# Patient Record
Sex: Male | Born: 1970 | Race: White | Hispanic: No | Marital: Married | Smoking: Current every day smoker
Health system: Southern US, Community
[De-identification: ages and names within clinical notes are randomized; demographics above are authoritative.]

## PROBLEM LIST (undated history)

## (undated) HISTORY — PX: INGUINAL HERNIA REPAIR: SUR1180

---

## 2003-04-03 ENCOUNTER — Emergency Department (HOSPITAL_COMMUNITY): Admission: EM | Admit: 2003-04-03 | Discharge: 2003-04-03 | Payer: Self-pay | Admitting: Emergency Medicine

## 2004-08-14 HISTORY — PX: VASECTOMY: SHX75

## 2015-04-29 ENCOUNTER — Emergency Department (HOSPITAL_COMMUNITY)
Admission: EM | Admit: 2015-04-29 | Discharge: 2015-04-29 | Disposition: A | Discharge status: Home / Self Care | Payer: Self-pay | Attending: Emergency Medicine | Admitting: Emergency Medicine

## 2015-04-29 ENCOUNTER — Emergency Department (HOSPITAL_COMMUNITY): Payer: Self-pay

## 2015-04-29 ENCOUNTER — Encounter (HOSPITAL_COMMUNITY): Payer: Self-pay | Admitting: *Deleted

## 2015-04-29 DIAGNOSIS — Z8719 Personal history of other diseases of the digestive system: Secondary | ICD-10-CM | POA: Insufficient documentation

## 2015-04-29 DIAGNOSIS — Z720 Tobacco use: Secondary | ICD-10-CM | POA: Insufficient documentation

## 2015-04-29 DIAGNOSIS — R079 Chest pain, unspecified: Secondary | ICD-10-CM | POA: Insufficient documentation

## 2015-04-29 DIAGNOSIS — Z79899 Other long term (current) drug therapy: Secondary | ICD-10-CM | POA: Insufficient documentation

## 2015-04-29 LAB — CBC WITH DIFFERENTIAL/PLATELET
Basophils Absolute: 0 10*3/uL (ref 0.0–0.1)
Basophils Relative: 0 %
EOS ABS: 0.2 10*3/uL (ref 0.0–0.7)
EOS PCT: 3 %
HCT: 42.4 % (ref 39.0–52.0)
Hemoglobin: 14.8 g/dL (ref 13.0–17.0)
LYMPHS ABS: 1.5 10*3/uL (ref 0.7–4.0)
LYMPHS PCT: 21 %
MCH: 33.3 pg (ref 26.0–34.0)
MCHC: 34.9 g/dL (ref 30.0–36.0)
MCV: 95.3 fL (ref 78.0–100.0)
MONO ABS: 0.5 10*3/uL (ref 0.1–1.0)
MONOS PCT: 7 %
Neutro Abs: 5 10*3/uL (ref 1.7–7.7)
Neutrophils Relative %: 69 %
PLATELETS: 233 10*3/uL (ref 150–400)
RBC: 4.45 MIL/uL (ref 4.22–5.81)
RDW: 12.3 % (ref 11.5–15.5)
WBC: 7.3 10*3/uL (ref 4.0–10.5)

## 2015-04-29 LAB — BASIC METABOLIC PANEL
Anion gap: 7 (ref 5–15)
BUN: 17 mg/dL (ref 6–20)
CHLORIDE: 102 mmol/L (ref 101–111)
CO2: 29 mmol/L (ref 22–32)
CREATININE: 0.97 mg/dL (ref 0.61–1.24)
Calcium: 8.8 mg/dL — ABNORMAL LOW (ref 8.9–10.3)
GFR calc Af Amer: >60 mL/min (ref >60)
GFR calc non Af Amer: >60 mL/min (ref >60)
GLUCOSE: 100 mg/dL — AB (ref 65–99)
POTASSIUM: 3.8 mmol/L (ref 3.5–5.1)
SODIUM: 138 mmol/L (ref 135–145)

## 2015-04-29 LAB — TROPONIN I

## 2015-04-29 NOTE — ED Provider Notes (Signed)
CSN: [id number]     Arrival date & time 04/29/15  1526 History   First MD Initiated Contact with Patient 04/29/15 1533     Chief Complaint  Patient presents with  . Chest Pain     (Consider location/radiation/quality/duration/timing/severity/associated sxs/prior Treatment) HPI  The patient is a 44 year old male, history of tobacco use, history of using supplements, has been using supplements for erectile dysfunction recently, one of these was "horny goat weed". He states that he has had chest pressure and heaviness over the last several days, seems to get worse on and off throughout the day, he does not exercise but is in good shape, never has any exertional symptoms, over the last several days he has noticed increased symptoms but not always with exertion, sometimes at rest, no associated shortness of breath, cough, vomiting, swelling of the legs, no risk factors for pulmonary embolism, takes no other medications. He stopped taking the supplements several days ago. There is no dysuria, diarrhea, rashes, headache, blurred vision or any other symptoms. There is no radiation of this discomfort to his arm shoulder or jaw.  History reviewed. No pertinent past medical history. Past Surgical History  Procedure Laterality Date  . Inguinal hernia repair Bilateral   . Vasectomy  2006   Family History  Problem Relation Age of Onset  . Heart attack Maternal Grandfather    Social History  Substance Use Topics  . Smoking status: Current Every Day Smoker -- 0.50 packs/day for 4 years    Types: Cigarettes  . Smokeless tobacco: None  . Alcohol Use: 1.8 oz/week    2 Shots of liquor, 1 Cans of beer per week    Review of Systems  All other systems reviewed and are negative.     Allergies  Other  Home Medications   Prior to Admission medications   Medication Sig Start Date End Date Taking? Authorizing Provider  Misc Natural Products (HORNY GOAT WEED) CAPS Take 1 capsule by mouth 2 (two)  times daily.   Yes Historical Provider, MD  Multiple Vitamin (MULTI VITAMIN DAILY PO) Take 1 tablet by mouth 2 (two) times daily.   Yes Historical Provider, MD  YOHIMBINE HCL PO Take 1 capsule by mouth daily.   Yes Historical Provider, MD   BP 109/81 mmHg  Pulse 69  Temp(Src) 98.3 F (36.8 C) (Oral)  Resp 12  Ht 6' (1.829 m)  Wt 175 lb (79.379 kg)  BMI 23.73 kg/m2  SpO2 100% Physical Exam  Constitutional: He appears well-developed and well-nourished. No distress.  HENT:  Head: Normocephalic and atraumatic.  Mouth/Throat: Oropharynx is clear and moist. No oropharyngeal exudate.  Eyes: Conjunctivae and EOM are normal. Pupils are equal, round, and reactive to light. Right eye exhibits no discharge. Left eye exhibits no discharge. No scleral icterus.  Neck: Normal range of motion. Neck supple. No JVD present. No thyromegaly present.  Cardiovascular: Normal rate, regular rhythm, normal heart sounds and intact distal pulses.  Exam reveals no gallop and no friction rub.   No murmur heard. Pulmonary/Chest: Effort normal and breath sounds normal. No respiratory distress. He has no wheezes. He has no rales.  Abdominal: Soft. Bowel sounds are normal. He exhibits no distension and no mass. There is no tenderness.  Musculoskeletal: Normal range of motion. He exhibits no edema or tenderness.  Lymphadenopathy:    He has no cervical adenopathy.  Neurological: He is alert. Coordination normal.  Skin: Skin is warm and dry. No rash noted. No erythema.  Psychiatric: He  has a normal mood and affect. His behavior is normal.  Nursing note and vitals reviewed.   ED Course  Procedures (including critical care time) Labs Review Labs Reviewed  BASIC METABOLIC PANEL - Abnormal; Notable for the following:    Glucose, Bld 100 (*)    Calcium 8.8 (*)    All other components within normal limits  CBC WITH DIFFERENTIAL/PLATELET  TROPONIN I    Imaging Review Dg Chest 2 View  04/29/2015   CLINICAL DATA:   Chest pain 3-4 days which and screws 1 laying down. Pain relief with 1 nitroglycerin. Nausea and vomiting 1 episode yesterday morning.  EXAM: CHEST  2 VIEW  COMPARISON:  None.  FINDINGS: Lungs are mildly hyperexpanded without consolidation or effusion. Cardiomediastinal silhouette is normal remaining bones and soft tissues are within normal.  IMPRESSION: No active cardiopulmonary disease.   Electronically Signed   By: Elberta Fortis M.D.   On: 04/29/2015 16:23   I have personally reviewed and evaluated these images and lab results as part of my medical decision-making.   EKG Interpretation   Date/Time:  Thursday April 29 2015 15:30:12 EDT Ventricular Rate:  77 PR Interval:  139 QRS Duration: 105 QT Interval:  381 QTC Calculation: 431 R Axis:   83 Text Interpretation:  Sinus rhythm Normal ECG No old tracing to compare  Confirmed by Deneshia Zucker  MD, Jhada Risk ([id number]) on 04/29/2015 3:34:35 PM      MDM   Final diagnoses:  Chest pain, unspecified chest pain type    The patient has low risk for coronary disease, EKG is normal, clinical exam is normal, check chest x-ray labs, symptoms have been ongoing for several days with a heaviness, no wheezing, no respiratory distress, may be related to supplements  Pt has normal VS - ECG and Trop are normal - with 4 days of ongoing sx - there is low risk of ACS - recommended outpt f/u.  Meds given in ED:  Medications - No data to display  New Prescriptions   No medications on file      Eber Hong, MD 04/29/15 1702

## 2015-04-29 NOTE — ED Notes (Signed)
MD at bedside. 

## 2015-04-29 NOTE — ED Notes (Addendum)
Pt c/o CP x 3-4 days. Pt called [facility] and they told pt to call EMS to come to ED. CP gets better upon laying down. EMS started 18g IV in lt a/c. 4 baby ASA and 1 Nitro given by EMS with pain complete relief. EKG showed NSR and Vitals WNL for EMS. Pt reports nausea with vomiting x 1 yesterday morning. Pt also reports weakness with CP.

## 2015-04-29 NOTE — Discharge Instructions (Signed)
Your testing has been normal - as you are having ongoing pain and the testing was normal - it is suggestive that your symptoms are not from your heart - please return immediately if they worsen.  Please obtain all of your results from medical records or have your doctors office obtain the results - share them with your doctor - you should be seen at your doctors office in the next 2 days. Call today to arrange your follow up. Take the medications as prescribed. Please review all of the medicines and only take them if you do not have an allergy to them. Please be aware that if you are taking birth control pills, taking other prescriptions, ESPECIALLY ANTIBIOTICS may make the birth control ineffective - if this is the case, either do not engage in sexual activity or use alternative methods of birth control such as condoms until you have finished the medicine and your family doctor says it is OK to restart them. If you are on a blood thinner such as COUMADIN, be aware that any other medicine that you take may cause the coumadin to either work too much, or not enough - you should have your coumadin level rechecked in next 7 days if this is the case.  ?  It is also a possibility that you have an allergic reaction to any of the medicines that you have been prescribed - Everybody reacts differently to medications and while MOST people have no trouble with most medicines, you may have a reaction such as nausea, vomiting, rash, swelling, shortness of breath. If this is the case, please stop taking the medicine immediately and contact your physician.  ?  You should return to the ER if you develop severe or worsening symptoms.

## 2017-03-27 IMAGING — DX DG CHEST 2V
2 series · 2 of 2 positions shown · non-contrast
Comparison: None.

CLINICAL DATA: Chest pain 3-4 days which and screws 1 laying down.
Pain relief with 1 nitroglycerin. Nausea and vomiting 1 episode
yesterday morning.

EXAM:
CHEST  2 VIEW

[chest pa]
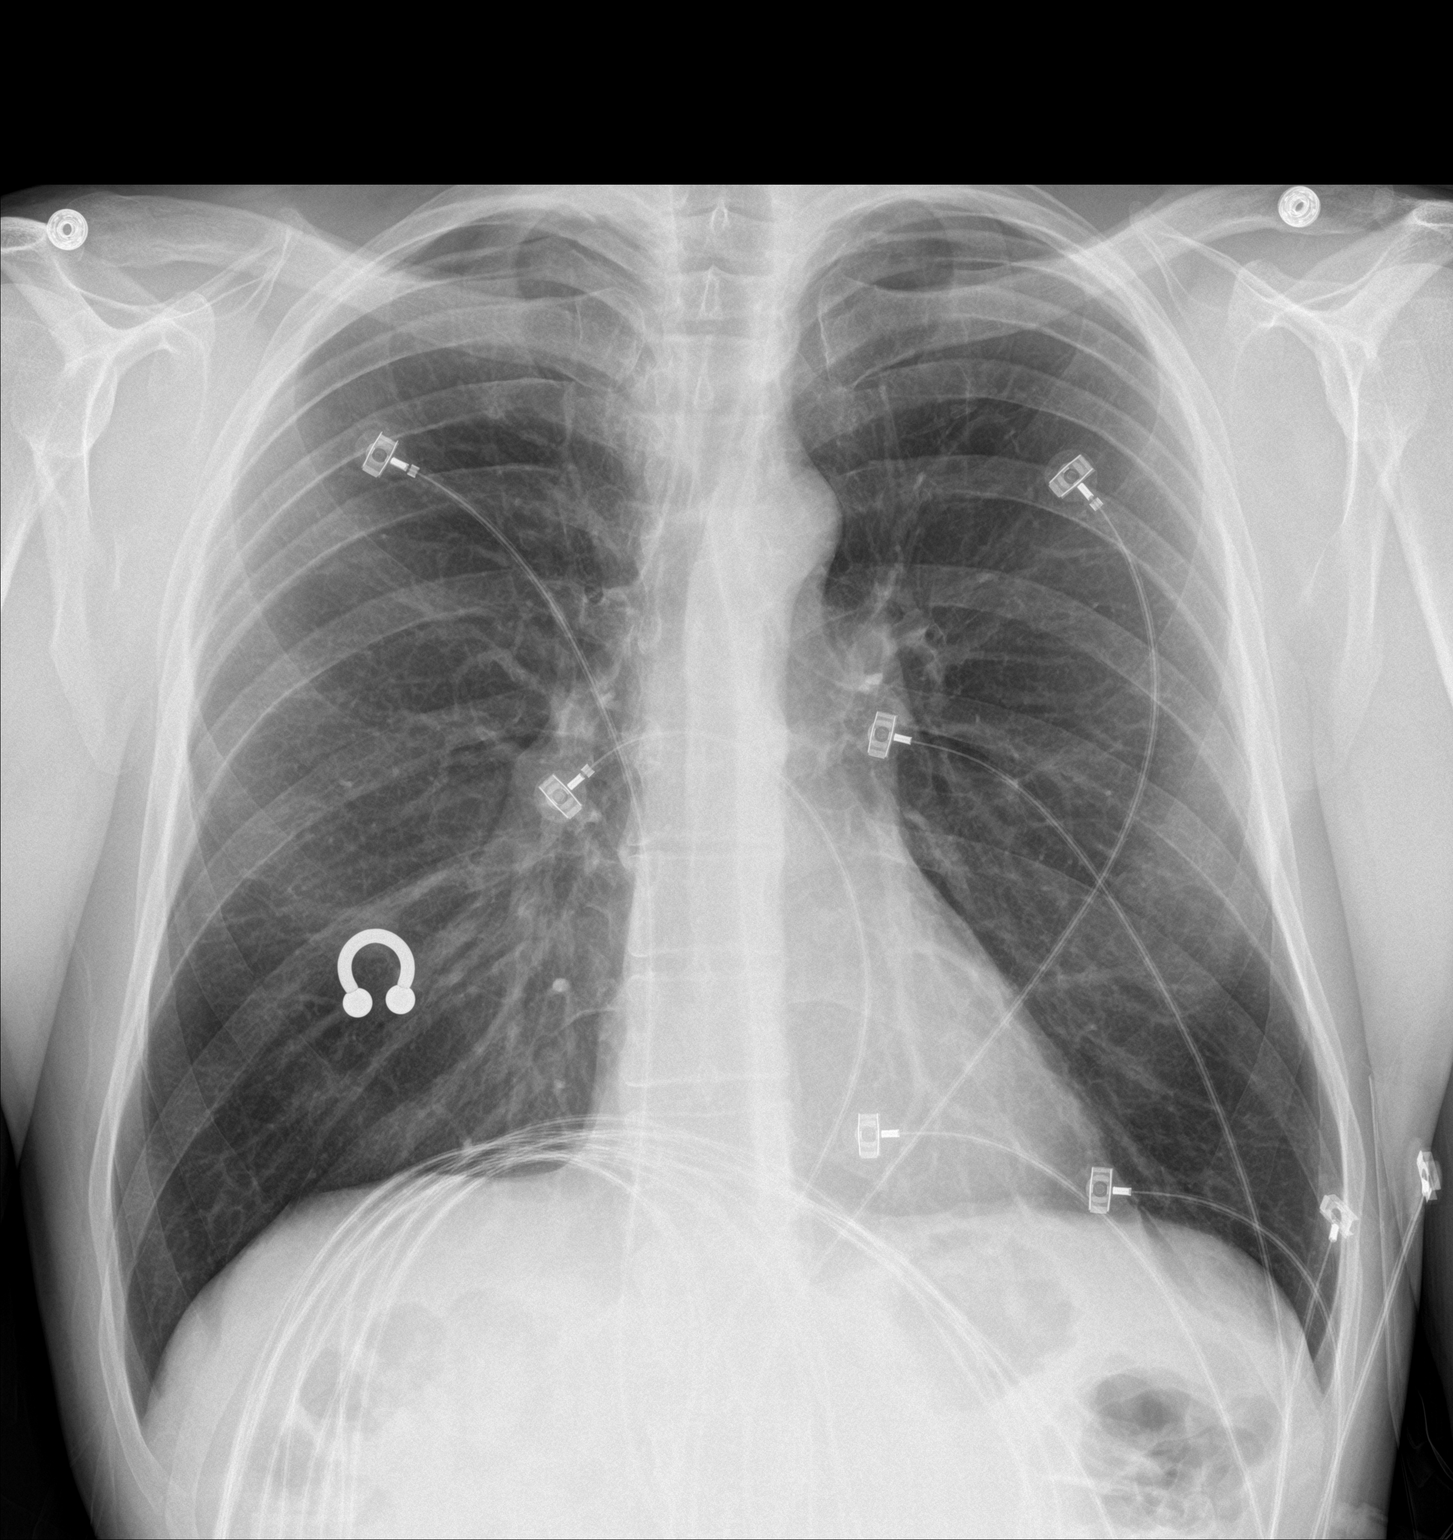

[chest lat]
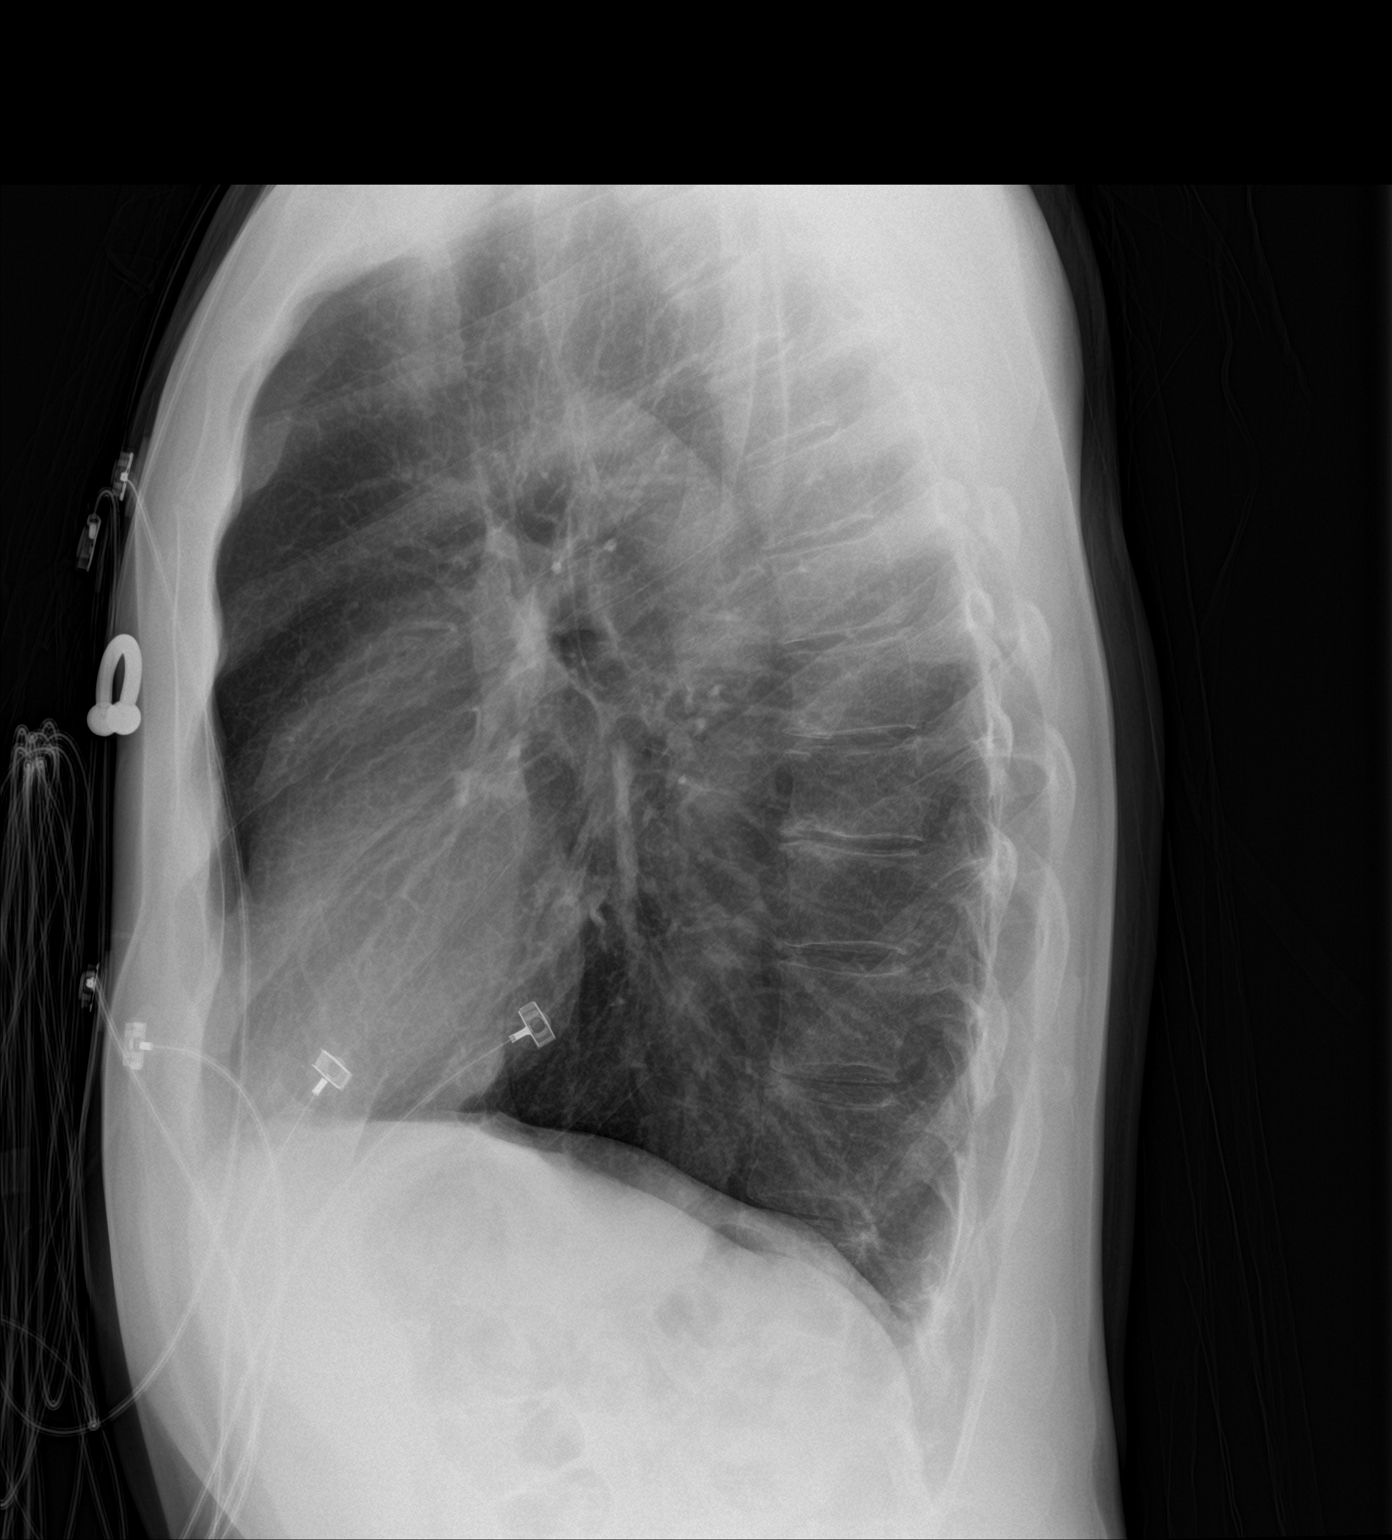

[2 of 2 positions shown; findings below may reference images not displayed]

FINDINGS: Lungs are mildly hyperexpanded without consolidation or effusion.
Cardiomediastinal silhouette is normal remaining bones and soft
tissues are within normal.
IMPRESSION: No active cardiopulmonary disease.

## 2019-10-12 ENCOUNTER — Ambulatory Visit: Payer: Self-pay | Attending: Internal Medicine

## 2019-10-12 DIAGNOSIS — Z23 Encounter for immunization: Secondary | ICD-10-CM | POA: Insufficient documentation

## 2019-10-12 NOTE — Progress Notes (Signed)
   Covid-19 Vaccination Clinic  Name:  Lance Myers    MRN: [id number] DOB: 10/11/1970  10/12/2019  Lance Myers was observed post Covid-19 immunization for 15 minutes without incidence. He was provided with Vaccine Information Sheet and instruction to access the V-Safe system.   Lance Myers was instructed to call 911 with any severe reactions post vaccine: Marland Kitchen Difficulty breathing  . Swelling of your face and throat  . A fast heartbeat  . A bad rash all over your body  . Dizziness and weakness    Immunizations Administered    Name Date Dose VIS Date Route   Moderna COVID-19 Vaccine 10/12/2019  1:14 PM 0.5 mL 07/15/2019 Intramuscular   Manufacturer: Moderna   Lot: [id number]   NDC: 68127-517-00

## 2019-11-15 ENCOUNTER — Ambulatory Visit: Payer: Self-pay | Attending: Internal Medicine

## 2019-11-15 DIAGNOSIS — Z23 Encounter for immunization: Secondary | ICD-10-CM

## 2019-11-15 NOTE — Progress Notes (Signed)
   Covid-19 Vaccination Clinic  Name:  Lance Myers    MRN: [id number] DOB: 15-Sep-1970  11/15/2019  Lance Myers was observed post Covid-19 immunization for 15 minutes without incident. He was provided with Vaccine Information Sheet and instruction to access the V-Safe system.   Lance Myers was instructed to call 911 with any severe reactions post vaccine: Marland Kitchen Difficulty breathing  . Swelling of face and throat  . A fast heartbeat  . A bad rash all over body  . Dizziness and weakness   Immunizations Administered    Name Date Dose VIS Date Route   Moderna COVID-19 Vaccine 11/15/2019  1:13 PM 0.5 mL 07/15/2019 Intramuscular   Manufacturer: Moderna   Lot: [id number]   NDC: 98069-996-72
